# Patient Record
Sex: Male | Born: 1985 | Race: White | Hispanic: No | Marital: Married | Smoking: Never smoker
Health system: Southern US, Community
[De-identification: ages and names within clinical notes are randomized; demographics above are authoritative.]

## PROBLEM LIST (undated history)

## (undated) HISTORY — PX: ANTERIOR CRUCIATE LIGAMENT REPAIR: SHX115

---
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
## 2007-07-01 ENCOUNTER — Inpatient Hospital Stay (HOSPITAL_COMMUNITY): Admission: EM | Admit: 2007-07-01 | Discharge: 2007-07-03 | Payer: Self-pay | Admitting: Emergency Medicine

## 2010-09-01 NOTE — H&P (Signed)
[name], [name]               ACCOUNT NO.:  [id number]   MEDICAL RECORD NO.:  [id number]          PATIENT TYPE:  INP   LOCATION:  1826                         FACILITY:  [facility]   PHYSICIAN:  Michiel Cowboy, MDDATE OF BIRTH:  1985/05/14   DATE OF ADMISSION:  07/01/2007  DATE OF DISCHARGE:                              HISTORY & PHYSICAL   CHIEF COMPLAINT:  Unable to swallow.   This is a 25 year old gentleman with recent ACL tear, status post repair  with 1 week history of fevers, chills, severe sore throat, and fullness  in the ears.  The patient presented to walk-in clinic at the point where  he had hard time swallowing his own saliva for the past day or so.  The  patient was found to be Monospot positive. Given the severity of his  symptoms, [facility] hospitalist was called for possible admission for  rehydration since the patient unable to swallow, including his own  saliva.   PAST MEDICAL HISTORY:  Unremarkable, except for recent ACL repair.  The  patient is currently still on crutches.   MEDICATIONS:  Currently using Robaxin p.r.n. as well as  Hydrocodone/APAP.   ALLERGIES:  NONE.   SOCIAL HISTORY:  Significant for tobacco abuse and alcohol abuse.  Occasional marijuana use.   FAMILY HISTORY:  Noncontributory.   REVIEW OF SYSTEMS:  Positive for fevers and chills, as per HPI,  otherwise negative.   PHYSICAL EXAMINATION:  VITAL SIGNS:  Blood pressure 134/81.  Heart rate  134.  Respirations 16.  Temperature 102.7.  Satting 95% on air.  GENERAL:  Young male in no severe distress, laying on a stretcher,  clearly unable to swallow.  HEENT:  Head nontraumatic.  Oropharynx significant for severe tonsil  enlargement.  Kissing tonsils with wide spread white exudate, severe  lymphadenopathy noted.  Reddish rash present.  ABDOMEN:  Nondistended, scaphoid.  Hard to determine presence of  splenomegaly.  LOWER EXTREMITIES:  Without edema.  SKIN:  Turgor diminished.  NEUROLOGIC:  Intact.   LABS:  White blood cell count 16.5.  Hemoglobin 15.3.  Platelets 404.  Sodium 128, potassium 4.2, creatinine 0.7, total bili 1.3, alk phos 303.  HT 77, LT 187, albumin 3.5.  Per records, Monospot positive.   ASSESSMENT AND PLAN:  1. This is a 25 year old gentleman with possible mononucleosis with      severe tonsillitis.  Will treat pain with morphine and Zofran.      Will obtain Ears, Nose, Throat evaluation given degree of swelling      and difficulty swallowing saliva.  May benefit from Solu-Medrol.      Will also evaluate for other causes of tonsillitis.  Obtain a      throat culture.  Repeat Monospot, obtain EBV, viral load.  2. Dehydration.  Intravenous fluids.  3. Elevated liver function tests likely secondary to mononucleosis.      Will obtain abdominal ultrasound to evaluate for splenomegaly.   Will evaluate for other causes of severe tonsillitis.  Will obtain human  immuno virus.      Michiel Cowboy, MD  Electronically Signed  AVD/MEDQ  D:  07/01/2007  T:  07/01/2007  Job:  [id number]   cc:   L. Lupe Carney, M.D.

## 2010-09-01 NOTE — Discharge Summary (Signed)
[name], [name]               ACCOUNT NO.:  [id number]   MEDICAL RECORD NO.:  [id number]          PATIENT TYPE:  INP   LOCATION:  5152                         FACILITY:  [facility]   PHYSICIAN:  [name], MDDATE OF BIRTH:  01-Apr-1986   DATE OF ADMISSION:  07/01/2007  DATE OF DISCHARGE:  07/03/2007                               DISCHARGE SUMMARY   PRIMARY CARE [name]:  Allen Soho, MD.   CONSULTANTS:  1. Allen Braun, MD.  2. ENT.   DISCHARGE DIAGNOSES:  1. Mononucleosis.  2. Dehydration.  3. Toxic tonsillitis secondary to mononucleosis.  4. Likely superimposed bacterial infection.  5. LFT elevations likely secondary to mononucleosis.   HOSPITAL COURSE:  This is a 25 year old gentleman who presented to  [facility] with severe protein, Monospot was positive and  patient was noted to be dehydrated with difficulty swallowing, at which  point he was directed to go to the emergency department.  At the  emergency department [facility] Hospitalists were called to admit the  patient.  On admission patient has had difficulty swallowing his saliva.  He was evaluated by ENT who felt that there was no peritonsillar abscess  noted, diagnosed with toxic tonsillitis.  At this point patient was  admitted to hospital for IV hydration.  Given severity of his illness,  ID was also consulted.  He was seen by [name] who agreed with  short burst of steroids and a short dose of clindamycin for 5 days.  The  use of acyclovir in this situation was thought to be controversial.  Patient did seem to do much better with just short bursts of steroids,  at which point we will discharge him home with 5 days of prednisone 40  and five days of clindamycin; thereafter, patient can discontinue this.  The patient is to follow up with his primary care [name] in about 1  week.  Of note, patient has had a right upper quadrant ultrasound done  that showed slight splenomegaly, this  is probably related to  mononucleosis.  He also had slight LFT elevation which is probably  related to mononucleosis.  He is to have this repeated as an outpatient  to make sure that his LFT elevations are resolved.  Also, as a part of  his workup patient has had an RPR and HIV done which both were negative.  A Strep test was negative.  Patient was found to have low levels of IgG  against EPV which is probably indicative of just early infection and  most likely will become positive later on.  Patient was instructed to  not participate in contact sports for at least 4 to 6 months and to have  splenomegaly followed up as an outpatient.  Patient was also instructed  not to kiss or share drinks.   For the dehydration, patient received IV fluids while in-house.  At the  time of discharge able to take p.o.   Pain control.  He was given Magic Mouthwash which has seemed to help a  great deal.  Will discharge patient on that.   Recent  ACL tear.  Discussed his care with Orthopedics who will follow up  him closely tomorrow.  Would defer to Orthopedics if okay to continue  with short bursts of steroids.  If Orthopedics feels that steroids could  interfere with joint healing, it is okay to discontinue his steroids  early.      Allen Cowboy, MD  Electronically Signed     AVD/MEDQ  D:  07/03/2007  T:  07/03/2007  Job:  [id number]   cc:   [name], M.D.  Allen Sell, MD  [name], M.D.

## 2011-01-11 LAB — CBC
HCT: 43.5
HCT: 37.5 — ABNORMAL LOW
HCT: 42
Hemoglobin: 15.3
Hemoglobin: 12.9 — ABNORMAL LOW
Hemoglobin: 14.6
MCHC: 35.2
MCHC: 34.3
MCHC: 34.7
MCV: 91.6
MCV: 91.9
MCV: 93.2
Platelets: 404 — ABNORMAL HIGH
Platelets: 361
Platelets: 404 — ABNORMAL HIGH
RBC: 4.75
RBC: 4.08 — ABNORMAL LOW
RBC: 4.51
RDW: 12.7
RDW: 12.8
RDW: 12.9
WBC: 16.5 — ABNORMAL HIGH
WBC: 11.7 — ABNORMAL HIGH
WBC: 12.6 — ABNORMAL HIGH

## 2011-01-11 LAB — COMPREHENSIVE METABOLIC PANEL
ALT: 187 — ABNORMAL HIGH
ALT: 100 — ABNORMAL HIGH
ALT: 122 — ABNORMAL HIGH
AST: 77 — ABNORMAL HIGH
AST: 32
AST: 56 — ABNORMAL HIGH
Albumin: 3.5
Albumin: 2.7 — ABNORMAL LOW
Albumin: 2.8 — ABNORMAL LOW
Alkaline Phosphatase: 303 — ABNORMAL HIGH
Alkaline Phosphatase: 210 — ABNORMAL HIGH
Alkaline Phosphatase: 216 — ABNORMAL HIGH
BUN: 9
BUN: 8
BUN: 8
CO2: 24
CO2: 27
CO2: 29
Calcium: 8.6
Calcium: 8 — ABNORMAL LOW
Calcium: 8.8
Chloride: 94 — ABNORMAL LOW
Chloride: 102
Chloride: 98
Creatinine, Ser: 0.77
Creatinine, Ser: 0.57
Creatinine, Ser: 0.77
GFR calc Af Amer: >60
GFR calc Af Amer: >60
GFR calc Af Amer: >60
GFR calc non Af Amer: >60
GFR calc non Af Amer: >60
GFR calc non Af Amer: >60
Glucose, Bld: 112 — ABNORMAL HIGH
Glucose, Bld: 108 — ABNORMAL HIGH
Glucose, Bld: 120 — ABNORMAL HIGH
Potassium: 4.2
Potassium: 4.1
Potassium: 4.7
Sodium: 128 — ABNORMAL LOW
Sodium: 136
Sodium: 136
Total Bilirubin: 1.3 — ABNORMAL HIGH
Total Bilirubin: 0.7
Total Bilirubin: 0.8
Total Protein: 8
Total Protein: 6.3
Total Protein: 7.2

## 2011-01-11 LAB — CULTURE, BLOOD (ROUTINE X 2)
Culture: NO GROWTH
Culture: NO GROWTH

## 2011-01-11 LAB — DIFFERENTIAL
Basophils Absolute: 0
Basophils Relative: 0
Eosinophils Absolute: 0
Eosinophils Relative: 0
Lymphocytes Relative: 35
Lymphs Abs: 5.7 — ABNORMAL HIGH
Monocytes Absolute: 2.9 — ABNORMAL HIGH
Monocytes Relative: 17 — ABNORMAL HIGH
Neutro Abs: 7.9 — ABNORMAL HIGH
Neutrophils Relative %: 48

## 2011-01-11 LAB — RPR: RPR Ser Ql: NONREACTIVE

## 2011-01-11 LAB — EBV AB TO VIRAL CAPSID AG PNL, IGG+IGM
EBV VCA IgG: 0.52
EBV VCA IgM: 3.02 — ABNORMAL HIGH

## 2011-01-11 LAB — STREP A DNA PROBE: Group A Strep Probe: NEGATIVE

## 2011-01-11 LAB — HIV ANTIBODY (ROUTINE TESTING W REFLEX): HIV: NONREACTIVE

## 2011-01-11 LAB — MONONUCLEOSIS SCREEN: Mono Screen: POSITIVE — AB

## 2011-05-09 ENCOUNTER — Encounter (HOSPITAL_COMMUNITY): Payer: Self-pay | Admitting: *Deleted

## 2011-05-09 ENCOUNTER — Emergency Department (INDEPENDENT_AMBULATORY_CARE_PROVIDER_SITE_OTHER)
Admission: EM | Admit: 2011-05-09 | Discharge: 2011-05-09 | Disposition: A | Discharge status: Home / Self Care | Payer: 59 | Source: Home / Self Care

## 2011-05-09 DIAGNOSIS — S61209A Unspecified open wound of unspecified finger without damage to nail, initial encounter: Secondary | ICD-10-CM

## 2011-05-09 DIAGNOSIS — S61219A Laceration without foreign body of unspecified finger without damage to nail, initial encounter: Secondary | ICD-10-CM

## 2011-05-09 NOTE — ED Notes (Signed)
Pt c/o laceration to LEFT index fingertip x approx 1 hour ago. States he cut same w/ an exacto knife while "doing some arts and crafts".

## 2011-05-09 NOTE — ED Provider Notes (Signed)
Medical screening examination/treatment/procedure(s) were performed by non-physician practitioner and as supervising physician I was immediately available for consultation/collaboration.   Amberlynn Tempesta DOUGLAS MD.    Rever Pichette Douglas Courvoisier Hamblen, MD 05/09/11 1925 

## 2011-05-09 NOTE — ED Provider Notes (Signed)
History     CSN: [id number]  Arrival date & time 05/09/11  1819   None     No chief complaint on file.   (Consider location/radiation/quality/duration/timing/severity/associated sxs/prior treatment) HPI Comments: Pt c/o laceration to tip of Lt index finger approximately one hr ago. Pt states he was using an exacto knife. Bleeding has stopped.  Last Tetnus vaccine was in 2007.    History reviewed. No pertinent past medical history.  Past Surgical History  Procedure Date  . Anterior cruciate ligament repair     History reviewed. No pertinent family history.  History  Substance Use Topics  . Smoking status: Never Smoker   . Smokeless tobacco: Never Used  . Alcohol Use: 1.8 oz/week    3 Cans of beer per week      Review of Systems  Musculoskeletal: Negative for joint swelling.  Neurological: Negative for weakness and numbness.    Allergies  Review of patient's allergies indicates no known allergies.  Home Medications  No current outpatient prescriptions on file.  BP 132/84  Pulse 62  Temp(Src) 99.1 F (37.3 C) (Oral)  Resp 14  SpO2 100%  Physical Exam  Nursing note and vitals reviewed. Constitutional: He appears well-developed and well-nourished. No distress.  HENT:  Head: Normocephalic and atraumatic.  Musculoskeletal:       Left hand: He exhibits laceration. He exhibits normal range of motion, no tenderness, no bony tenderness, normal two-point discrimination, normal capillary refill, no deformity and no swelling. normal sensation noted. Normal strength noted.       Hands: Neurological: No sensory deficit.  Skin: Skin is warm and dry.  Psychiatric: He has a normal mood and affect.    ED Course  Procedures (including critical care time)  Labs Reviewed - No data to display No results found.   1. Laceration of finger, left       MDM   Small laceration, no bleeding and wound edges approximated.        Melody Comas, [place] 05/09/11 [phone]

## 2015-08-02 ENCOUNTER — Ambulatory Visit (INDEPENDENT_AMBULATORY_CARE_PROVIDER_SITE_OTHER): Payer: BLUE CROSS/BLUE SHIELD | Admitting: Family Medicine

## 2015-08-02 VITALS — BP 110/68 | HR 67 | Temp 98.7°F | Resp 18 | Ht 68.0 in | Wt 140.8 lb

## 2015-08-02 DIAGNOSIS — S61305A Unspecified open wound of left ring finger with damage to nail, initial encounter: Secondary | ICD-10-CM

## 2015-08-02 DIAGNOSIS — S61215A Laceration without foreign body of left ring finger without damage to nail, initial encounter: Secondary | ICD-10-CM | POA: Diagnosis not present

## 2015-08-02 DIAGNOSIS — S61219A Laceration without foreign body of unspecified finger without damage to nail, initial encounter: Secondary | ICD-10-CM

## 2015-08-02 DIAGNOSIS — Z23 Encounter for immunization: Secondary | ICD-10-CM

## 2015-08-02 DIAGNOSIS — T07XXXA Unspecified multiple injuries, initial encounter: Secondary | ICD-10-CM

## 2015-08-02 DIAGNOSIS — S61309A Unspecified open wound of unspecified finger with damage to nail, initial encounter: Secondary | ICD-10-CM

## 2015-08-02 NOTE — Progress Notes (Signed)
Patient ID: [name], male    DOB: [birth year]  Age: 30 y.o. MRN: [id number]  Chief Complaint  Patient presents with  . Finger Injury    left ring finger, pt. fell from skateboard     Subjective:   30 year old man who was skateboarding this morning and fell. He got a little abrasion on his left knee and his right forearm, but the main concern is that he tore the left fourth fingernail backwards. That is why he came on in. 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  Current allergies, medications, problem list, past/family and social histories reviewed.  Objective:  BP 110/68 mmHg  Pulse 67  Temp(Src) 98.7 F (37.1 C) (Oral)  Resp 18  Ht 5\' 8"  (1.727 m)  Wt 140 lb 12.8 oz (63.866 kg)  BMI 21.41 kg/m2  SpO2 99%  No major distress. Abrasions of the forearm and knee is noted. The left fourth finger has partially avulsed nail, which has reconnected normal position. The proximal aspect of the nail was pushed back under the edge of the skin. The skin flap was torn from proximal to distal along the medial half of the nail around the cuticle. It is a very tan laceration of the skin. No laceration of the nail plate can be seen, and I do not believe it tore into the matrix deep to that. Dermabond is used after irrigating well to try to reattach the skin in its place. He will try and protect the nail and see whether it persists in growing out normally, or whether he ends up having a partial evulsion that would require removal of the nail.  Procedure note: Laceration around the nailbeds along the cuticle was repaired with Dermabond. Patient was instructed in its care.  Assessment & Plan:   Assessment: No diagnosis found.    Plan: Per orders  No orders of the defined types were placed in this encounter.    No orders of the defined types were placed in this encounter.         Patient Instructions       IF you received an x-ray today,  you will receive an invoice from [facility]. Please contact [facility] at [phone] with questions or concerns regarding your invoice.   IF you received labwork today, you will receive an invoice from [facility]. Please contact [facility] at [phone] with questions or concerns regarding your invoice.   Our billing staff will not be able to assist you with questions regarding bills from these companies.  You will be contacted with the lab results as soon as they are available. The fastest way to get your results is to activate your My Chart account. Instructions are located on the last page of this paperwork. If you have not heard from us regarding the results in 2 weeks, please contact this office.         No Follow-up on file.   Oshay Stranahan, MD 08/02/2015

## 2015-08-02 NOTE — Patient Instructions (Addendum)
Protect fingertip as discussed.  If the nail seems to be avulsing please return for a recheck  Keep the other abrasions clean and dry.  You have received a tetanus vaccination today. They are maybe a little bit sore for a couple of days, takes Tylenol ibuprofen if needed for that discomfort and the discomfort of the fall.  Return at any time if needed.    IF you received an x-ray today, you will receive an invoice from [facility]. Please contact [facility] at [phone] with questions or concerns regarding your invoice.   IF you received labwork today, you will receive an invoice from [facility]. Please contact [facility] at [phone] with questions or concerns regarding your invoice.   Our billing staff will not be able to assist you with questions regarding bills from these companies.  You will be contacted with the lab results as soon as they are available. The fastest way to get your results is to activate your My Chart account. Instructions are located on the last page of this paperwork. If you have not heard from us regarding the results in 2 weeks, please contact this office.

## 2015-08-30 ENCOUNTER — Ambulatory Visit: Payer: BLUE CROSS/BLUE SHIELD

## 2016-08-09 DIAGNOSIS — D225 Melanocytic nevi of trunk: Secondary | ICD-10-CM | POA: Diagnosis not present

## 2016-08-09 DIAGNOSIS — D2262 Melanocytic nevi of left upper limb, including shoulder: Secondary | ICD-10-CM | POA: Diagnosis not present

## 2016-08-09 DIAGNOSIS — D2261 Melanocytic nevi of right upper limb, including shoulder: Secondary | ICD-10-CM | POA: Diagnosis not present

## 2016-08-09 DIAGNOSIS — D485 Neoplasm of uncertain behavior of skin: Secondary | ICD-10-CM | POA: Diagnosis not present

## 2016-08-09 DIAGNOSIS — L812 Freckles: Secondary | ICD-10-CM | POA: Diagnosis not present

## 2018-05-04 DIAGNOSIS — L259 Unspecified contact dermatitis, unspecified cause: Secondary | ICD-10-CM | POA: Diagnosis not present

## 2019-03-31 DIAGNOSIS — Z20828 Contact with and (suspected) exposure to other viral communicable diseases: Secondary | ICD-10-CM | POA: Diagnosis not present

## 2019-09-19 DIAGNOSIS — M4316 Spondylolisthesis, lumbar region: Secondary | ICD-10-CM | POA: Diagnosis not present

## 2019-09-19 DIAGNOSIS — M545 Low back pain: Secondary | ICD-10-CM | POA: Diagnosis not present

## 2019-09-19 DIAGNOSIS — M47816 Spondylosis without myelopathy or radiculopathy, lumbar region: Secondary | ICD-10-CM | POA: Diagnosis not present

## 2019-09-19 DIAGNOSIS — Z6820 Body mass index (BMI) 20.0-20.9, adult: Secondary | ICD-10-CM | POA: Diagnosis not present

## 2019-10-03 DIAGNOSIS — M6281 Muscle weakness (generalized): Secondary | ICD-10-CM | POA: Diagnosis not present

## 2019-10-03 DIAGNOSIS — M545 Low back pain: Secondary | ICD-10-CM | POA: Diagnosis not present

## 2019-10-03 DIAGNOSIS — R2689 Other abnormalities of gait and mobility: Secondary | ICD-10-CM | POA: Diagnosis not present

## 2019-10-11 DIAGNOSIS — M6281 Muscle weakness (generalized): Secondary | ICD-10-CM | POA: Diagnosis not present

## 2019-10-11 DIAGNOSIS — R2689 Other abnormalities of gait and mobility: Secondary | ICD-10-CM | POA: Diagnosis not present

## 2019-10-11 DIAGNOSIS — M545 Low back pain: Secondary | ICD-10-CM | POA: Diagnosis not present

## 2019-10-16 DIAGNOSIS — F411 Generalized anxiety disorder: Secondary | ICD-10-CM | POA: Diagnosis not present

## 2019-10-16 DIAGNOSIS — Z0000 Encounter for general adult medical examination without abnormal findings: Secondary | ICD-10-CM | POA: Diagnosis not present

## 2019-10-16 DIAGNOSIS — Z23 Encounter for immunization: Secondary | ICD-10-CM | POA: Diagnosis not present

## 2019-10-16 DIAGNOSIS — H6122 Impacted cerumen, left ear: Secondary | ICD-10-CM | POA: Diagnosis not present

## 2019-10-16 DIAGNOSIS — Z13220 Encounter for screening for lipoid disorders: Secondary | ICD-10-CM | POA: Diagnosis not present

## 2019-10-18 DIAGNOSIS — M545 Low back pain: Secondary | ICD-10-CM | POA: Diagnosis not present

## 2019-10-18 DIAGNOSIS — M6281 Muscle weakness (generalized): Secondary | ICD-10-CM | POA: Diagnosis not present

## 2019-10-18 DIAGNOSIS — R2689 Other abnormalities of gait and mobility: Secondary | ICD-10-CM | POA: Diagnosis not present

## 2019-10-25 DIAGNOSIS — M545 Low back pain: Secondary | ICD-10-CM | POA: Diagnosis not present

## 2019-10-25 DIAGNOSIS — M6281 Muscle weakness (generalized): Secondary | ICD-10-CM | POA: Diagnosis not present

## 2019-10-25 DIAGNOSIS — R2689 Other abnormalities of gait and mobility: Secondary | ICD-10-CM | POA: Diagnosis not present

## 2019-11-01 DIAGNOSIS — M545 Low back pain: Secondary | ICD-10-CM | POA: Diagnosis not present

## 2019-11-01 DIAGNOSIS — R2689 Other abnormalities of gait and mobility: Secondary | ICD-10-CM | POA: Diagnosis not present

## 2019-11-01 DIAGNOSIS — M6281 Muscle weakness (generalized): Secondary | ICD-10-CM | POA: Diagnosis not present

## 2019-11-15 DIAGNOSIS — Z6820 Body mass index (BMI) 20.0-20.9, adult: Secondary | ICD-10-CM | POA: Diagnosis not present

## 2019-11-15 DIAGNOSIS — M6281 Muscle weakness (generalized): Secondary | ICD-10-CM | POA: Diagnosis not present

## 2019-11-15 DIAGNOSIS — M545 Low back pain: Secondary | ICD-10-CM | POA: Diagnosis not present

## 2019-11-15 DIAGNOSIS — R2689 Other abnormalities of gait and mobility: Secondary | ICD-10-CM | POA: Diagnosis not present

## 2019-11-15 DIAGNOSIS — M4316 Spondylolisthesis, lumbar region: Secondary | ICD-10-CM | POA: Diagnosis not present

## 2019-11-15 DIAGNOSIS — M47816 Spondylosis without myelopathy or radiculopathy, lumbar region: Secondary | ICD-10-CM | POA: Diagnosis not present

## 2019-12-06 DIAGNOSIS — M6281 Muscle weakness (generalized): Secondary | ICD-10-CM | POA: Diagnosis not present

## 2019-12-06 DIAGNOSIS — M545 Low back pain: Secondary | ICD-10-CM | POA: Diagnosis not present

## 2019-12-06 DIAGNOSIS — R2689 Other abnormalities of gait and mobility: Secondary | ICD-10-CM | POA: Diagnosis not present

## 2019-12-20 DIAGNOSIS — M6281 Muscle weakness (generalized): Secondary | ICD-10-CM | POA: Diagnosis not present

## 2019-12-20 DIAGNOSIS — R2689 Other abnormalities of gait and mobility: Secondary | ICD-10-CM | POA: Diagnosis not present

## 2019-12-20 DIAGNOSIS — M545 Low back pain: Secondary | ICD-10-CM | POA: Diagnosis not present

## 2020-02-28 DIAGNOSIS — Z03818 Encounter for observation for suspected exposure to other biological agents ruled out: Secondary | ICD-10-CM | POA: Diagnosis not present

## 2020-02-28 DIAGNOSIS — Z20822 Contact with and (suspected) exposure to covid-19: Secondary | ICD-10-CM | POA: Diagnosis not present

## 2020-03-20 DIAGNOSIS — Z20822 Contact with and (suspected) exposure to covid-19: Secondary | ICD-10-CM | POA: Diagnosis not present

## 2020-03-28 DIAGNOSIS — Z20822 Contact with and (suspected) exposure to covid-19: Secondary | ICD-10-CM | POA: Diagnosis not present

## 2020-03-28 DIAGNOSIS — Z03818 Encounter for observation for suspected exposure to other biological agents ruled out: Secondary | ICD-10-CM | POA: Diagnosis not present

## 2020-05-06 DIAGNOSIS — Z20822 Contact with and (suspected) exposure to covid-19: Secondary | ICD-10-CM | POA: Diagnosis not present

## 2020-07-10 DIAGNOSIS — M4316 Spondylolisthesis, lumbar region: Secondary | ICD-10-CM | POA: Diagnosis not present

## 2020-07-10 DIAGNOSIS — M47816 Spondylosis without myelopathy or radiculopathy, lumbar region: Secondary | ICD-10-CM | POA: Diagnosis not present

## 2020-07-18 ENCOUNTER — Other Ambulatory Visit: Payer: Self-pay | Admitting: Orthopaedic Surgery

## 2020-07-18 DIAGNOSIS — M4316 Spondylolisthesis, lumbar region: Secondary | ICD-10-CM

## 2020-07-25 ENCOUNTER — Ambulatory Visit
Admission: RE | Admit: 2020-07-25 | Discharge: 2020-07-25 | Disposition: A | Discharge status: Home / Self Care | Payer: BC Managed Care – PPO | Source: Ambulatory Visit | Attending: Orthopaedic Surgery | Admitting: Orthopaedic Surgery

## 2020-07-25 DIAGNOSIS — M48061 Spinal stenosis, lumbar region without neurogenic claudication: Secondary | ICD-10-CM | POA: Diagnosis not present

## 2020-07-25 DIAGNOSIS — M4316 Spondylolisthesis, lumbar region: Secondary | ICD-10-CM

## 2020-07-25 DIAGNOSIS — M545 Low back pain, unspecified: Secondary | ICD-10-CM | POA: Diagnosis not present

## 2020-08-08 DIAGNOSIS — M47816 Spondylosis without myelopathy or radiculopathy, lumbar region: Secondary | ICD-10-CM | POA: Diagnosis not present

## 2020-08-08 DIAGNOSIS — M4316 Spondylolisthesis, lumbar region: Secondary | ICD-10-CM | POA: Diagnosis not present

## 2020-09-04 DIAGNOSIS — M47816 Spondylosis without myelopathy or radiculopathy, lumbar region: Secondary | ICD-10-CM | POA: Diagnosis not present

## 2020-09-04 DIAGNOSIS — M5417 Radiculopathy, lumbosacral region: Secondary | ICD-10-CM | POA: Diagnosis not present

## 2020-09-04 DIAGNOSIS — M4316 Spondylolisthesis, lumbar region: Secondary | ICD-10-CM | POA: Diagnosis not present

## 2020-09-16 DIAGNOSIS — M5417 Radiculopathy, lumbosacral region: Secondary | ICD-10-CM | POA: Diagnosis not present

## 2020-10-07 DIAGNOSIS — M5417 Radiculopathy, lumbosacral region: Secondary | ICD-10-CM | POA: Diagnosis not present

## 2020-10-07 DIAGNOSIS — M4316 Spondylolisthesis, lumbar region: Secondary | ICD-10-CM | POA: Diagnosis not present

## 2020-11-17 DIAGNOSIS — M5417 Radiculopathy, lumbosacral region: Secondary | ICD-10-CM | POA: Diagnosis not present

## 2020-11-17 DIAGNOSIS — M4316 Spondylolisthesis, lumbar region: Secondary | ICD-10-CM | POA: Diagnosis not present

## 2021-01-01 DIAGNOSIS — R519 Headache, unspecified: Secondary | ICD-10-CM | POA: Diagnosis not present

## 2021-01-01 DIAGNOSIS — J029 Acute pharyngitis, unspecified: Secondary | ICD-10-CM | POA: Diagnosis not present

## 2021-01-01 DIAGNOSIS — J208 Acute bronchitis due to other specified organisms: Secondary | ICD-10-CM | POA: Diagnosis not present

## 2021-01-01 DIAGNOSIS — Z20822 Contact with and (suspected) exposure to covid-19: Secondary | ICD-10-CM | POA: Diagnosis not present

## 2021-02-17 DIAGNOSIS — M4316 Spondylolisthesis, lumbar region: Secondary | ICD-10-CM | POA: Diagnosis not present

## 2021-02-17 DIAGNOSIS — M5417 Radiculopathy, lumbosacral region: Secondary | ICD-10-CM | POA: Diagnosis not present

## 2021-07-22 DIAGNOSIS — Z03818 Encounter for observation for suspected exposure to other biological agents ruled out: Secondary | ICD-10-CM | POA: Diagnosis not present

## 2021-07-22 DIAGNOSIS — J029 Acute pharyngitis, unspecified: Secondary | ICD-10-CM | POA: Diagnosis not present

## 2021-07-22 DIAGNOSIS — Z20822 Contact with and (suspected) exposure to covid-19: Secondary | ICD-10-CM | POA: Diagnosis not present

## 2021-08-17 DIAGNOSIS — M5417 Radiculopathy, lumbosacral region: Secondary | ICD-10-CM | POA: Diagnosis not present

## 2021-08-17 DIAGNOSIS — M4316 Spondylolisthesis, lumbar region: Secondary | ICD-10-CM | POA: Diagnosis not present

## 2021-09-23 DIAGNOSIS — M5417 Radiculopathy, lumbosacral region: Secondary | ICD-10-CM | POA: Diagnosis not present

## 2021-10-14 DIAGNOSIS — M5417 Radiculopathy, lumbosacral region: Secondary | ICD-10-CM | POA: Diagnosis not present

## 2021-10-14 DIAGNOSIS — M4316 Spondylolisthesis, lumbar region: Secondary | ICD-10-CM | POA: Diagnosis not present

## 2021-12-30 DIAGNOSIS — D225 Melanocytic nevi of trunk: Secondary | ICD-10-CM | POA: Diagnosis not present

## 2021-12-30 DIAGNOSIS — D1801 Hemangioma of skin and subcutaneous tissue: Secondary | ICD-10-CM | POA: Diagnosis not present

## 2021-12-30 DIAGNOSIS — D2261 Melanocytic nevi of right upper limb, including shoulder: Secondary | ICD-10-CM | POA: Diagnosis not present

## 2021-12-30 DIAGNOSIS — D2262 Melanocytic nevi of left upper limb, including shoulder: Secondary | ICD-10-CM | POA: Diagnosis not present

## 2021-12-30 DIAGNOSIS — D2271 Melanocytic nevi of right lower limb, including hip: Secondary | ICD-10-CM | POA: Diagnosis not present

## 2022-03-01 DIAGNOSIS — M4316 Spondylolisthesis, lumbar region: Secondary | ICD-10-CM | POA: Diagnosis not present

## 2022-03-01 DIAGNOSIS — M5417 Radiculopathy, lumbosacral region: Secondary | ICD-10-CM | POA: Diagnosis not present

## 2022-03-22 DIAGNOSIS — M5417 Radiculopathy, lumbosacral region: Secondary | ICD-10-CM | POA: Diagnosis not present

## 2022-04-22 DIAGNOSIS — M5417 Radiculopathy, lumbosacral region: Secondary | ICD-10-CM | POA: Diagnosis not present

## 2022-04-22 DIAGNOSIS — M4316 Spondylolisthesis, lumbar region: Secondary | ICD-10-CM | POA: Diagnosis not present

## 2022-05-04 DIAGNOSIS — Z7251 High risk heterosexual behavior: Secondary | ICD-10-CM | POA: Diagnosis not present

## 2022-05-04 DIAGNOSIS — N50812 Left testicular pain: Secondary | ICD-10-CM | POA: Diagnosis not present

## 2022-05-24 DIAGNOSIS — M4316 Spondylolisthesis, lumbar region: Secondary | ICD-10-CM | POA: Diagnosis not present

## 2022-05-24 DIAGNOSIS — M5417 Radiculopathy, lumbosacral region: Secondary | ICD-10-CM | POA: Diagnosis not present

## 2022-05-28 DIAGNOSIS — N50812 Left testicular pain: Secondary | ICD-10-CM | POA: Diagnosis not present

## 2022-06-21 DIAGNOSIS — M5417 Radiculopathy, lumbosacral region: Secondary | ICD-10-CM | POA: Diagnosis not present

## 2022-06-30 IMAGING — MR MR LUMBAR SPINE W/O CM
4 of 5 series · 26 of 48 positions shown · non-contrast
Comparison: None available.

CLINICAL DATA: Initial evaluation for low back pain with radiation
into the left lower extremity for several years, worsening.

EXAM:
MRI LUMBAR SPINE WITHOUT CONTRAST
TECHNIQUE: Multiplanar, multisequence MR imaging of the lumbar spine was
performed. No intravenous contrast was administered.

[Series 3: T2 · sagittal · 4.0mm · 0.59mm/px · 5 of 16 slices shown (1 of 2)]
[im 1/16]
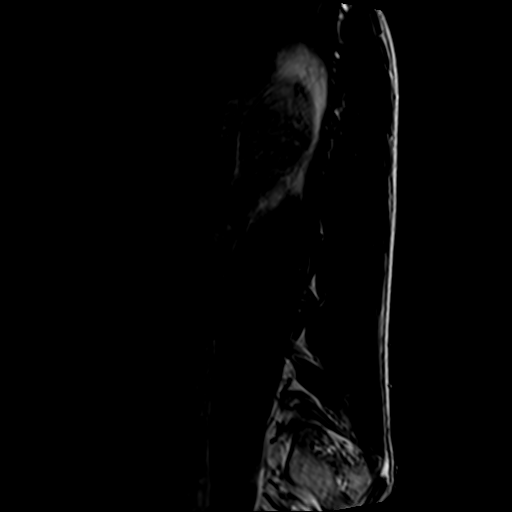
[im 4/16]
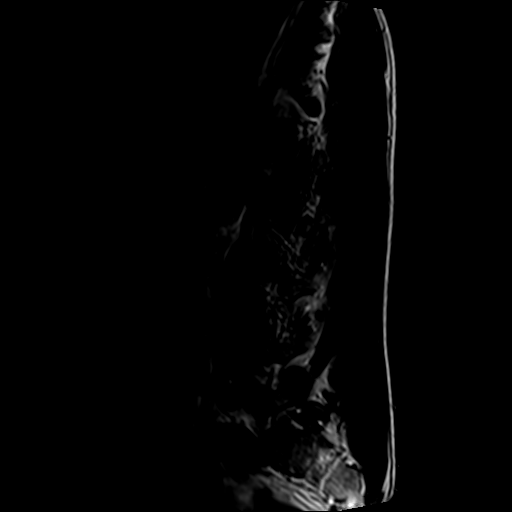
[im 8/16]
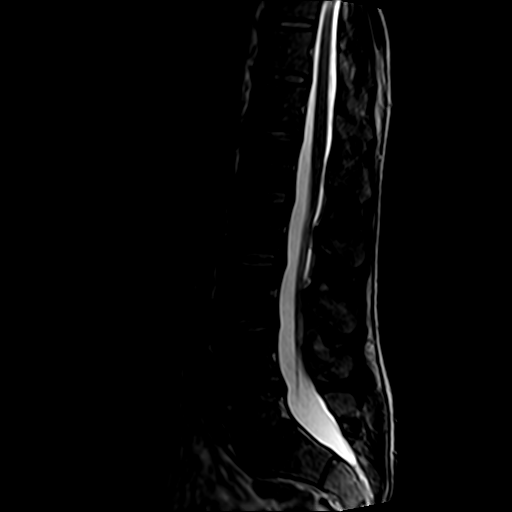
[im 12/16]
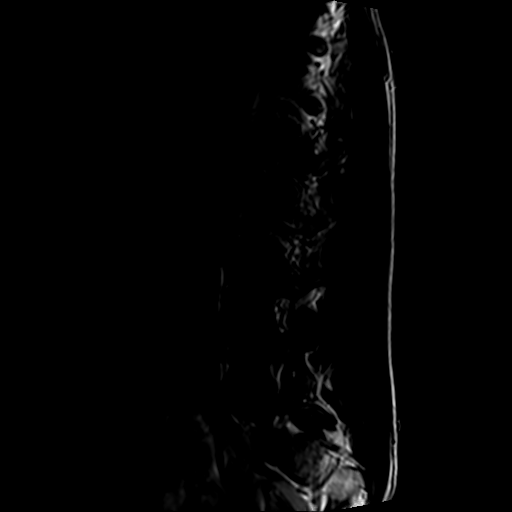
[im 16/16]
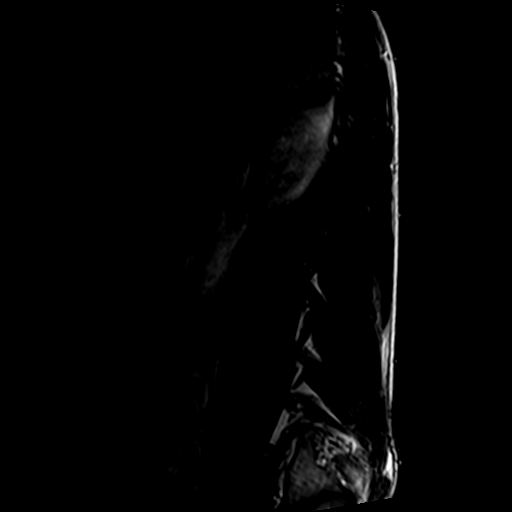

[Series 5: T1 · sagittal · 4.0mm · 0.59mm/px · 6 of 16 slices shown (1 of 2)]
[im 1/16]
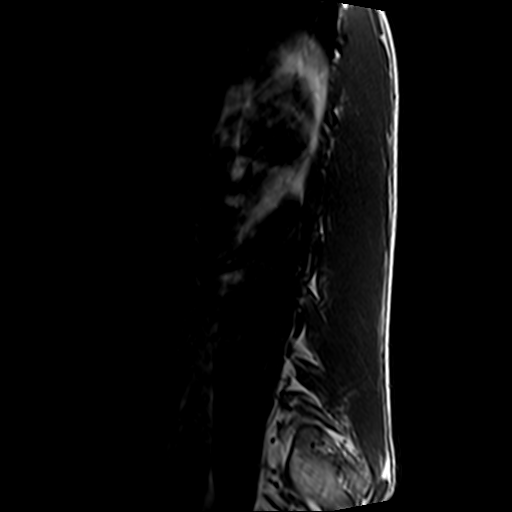
[im 4/16]
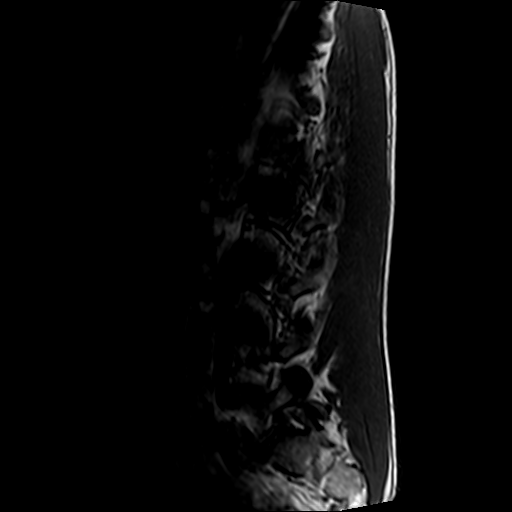
[im 7/16]
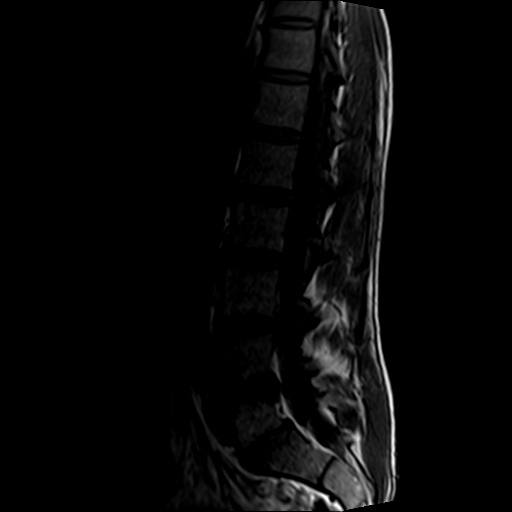
[im 10/16]
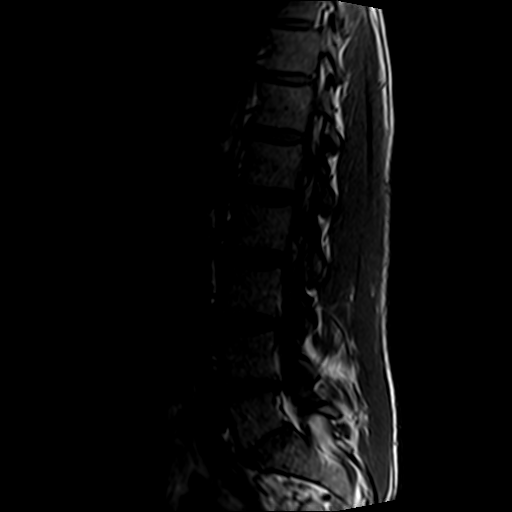
[im 13/16]
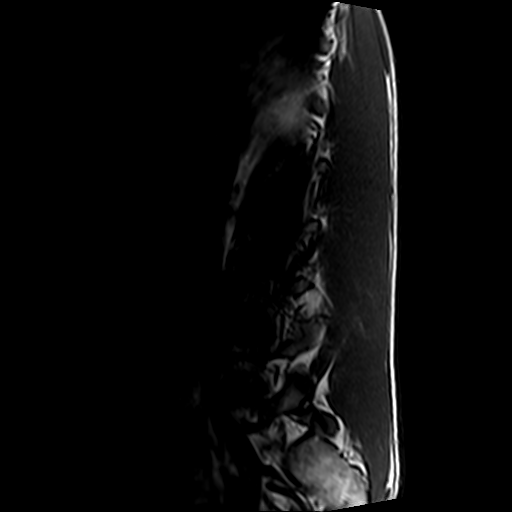
[im 16/16]
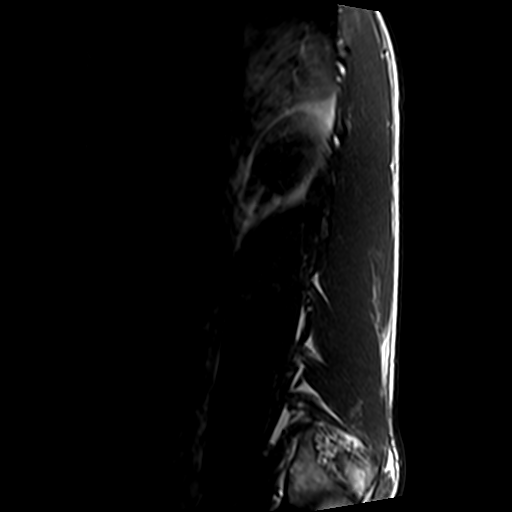

[Series 6: T2 · axial · 4.0mm · 0.70mm/px · z∈[-123,+101]mm · 10 of 44 slices shown (2 of 2)]
[im 3/44]
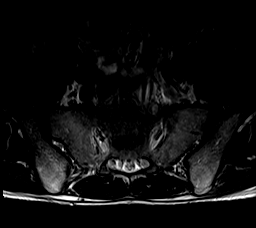
[im 6/44]
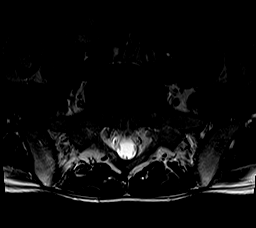
[im 9/44]
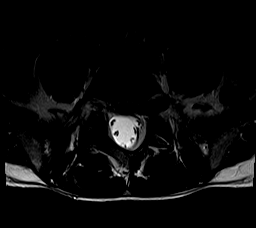
[im 15/44]
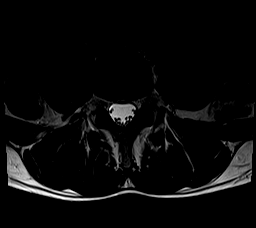
[im 21/44]
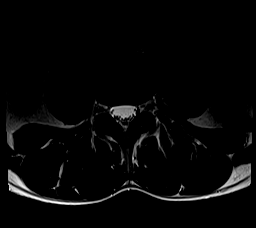
[im 23/44]
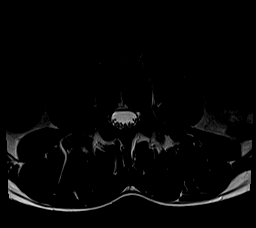
[im 26/44]
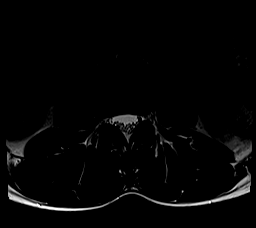
[im 32/44]
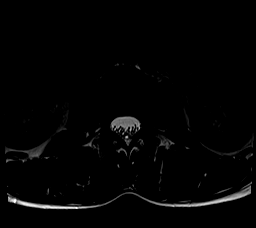
[im 38/44]
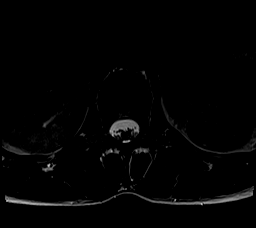
[im 44/44]
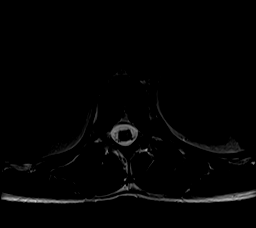

[Series 7: T1 · axial · 4.0mm · 0.35mm/px · z∈[-123,+69]mm · 5 of 44 slices shown (2 of 2)]
[im 3/44]
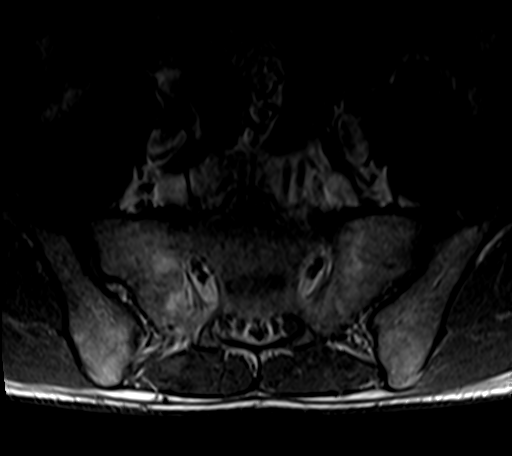
[im 6/44]
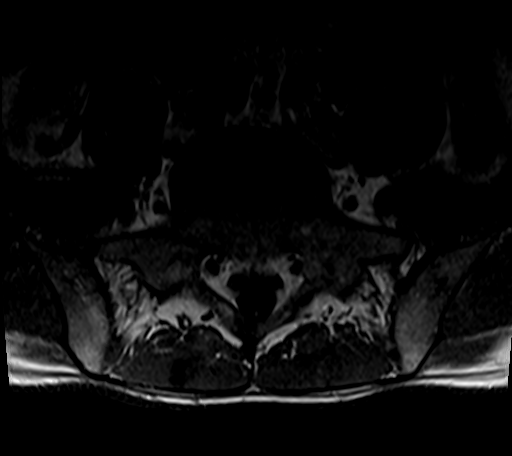
[im 9/44]
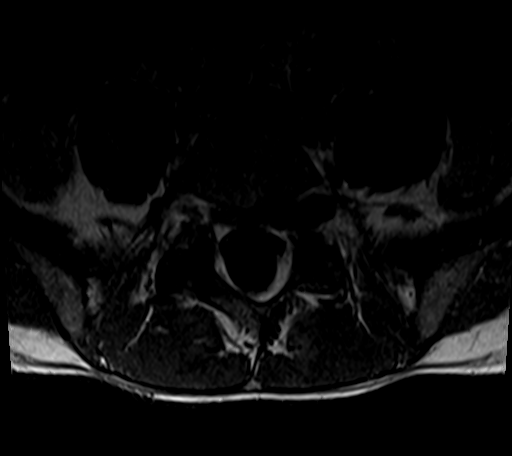
[im 23/44]
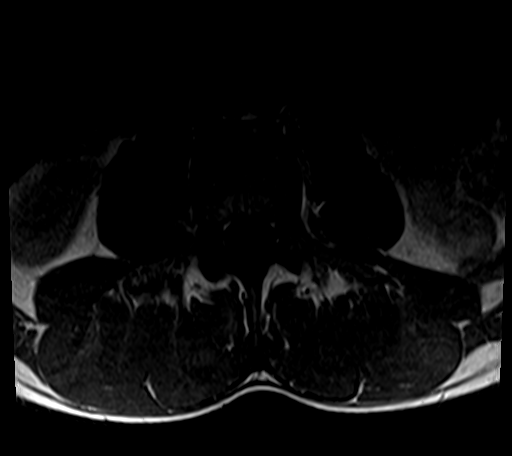
[im 38/44]
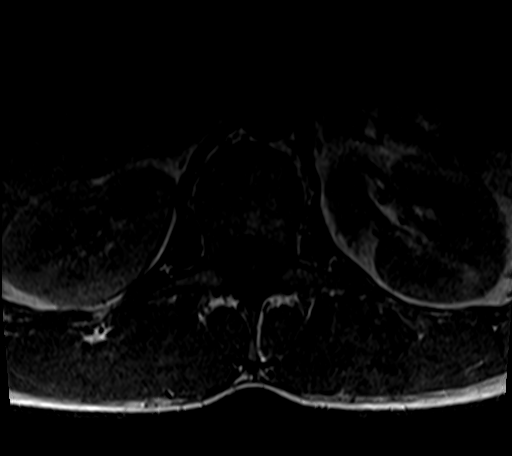

[26 of 48 positions shown; findings below may reference images not displayed]

FINDINGS: Segmentation: Transitional lumbosacral anatomy with partial
sacralization of the L5 vertebral body.

Alignment: Chronic bilateral pars defects at L5 with associated 3 mm
spondylolisthesis. Associated 3 mm retrolisthesis of L4 on L5.
Alignment otherwise normal preservation of the normal lumbar
lordosis.

Vertebrae: Vertebral body height maintained without acute or chronic
fracture. Bone marrow signal intensity within normal limits. No
discrete or worrisome osseous lesions. Discogenic reactive endplate
change present about the L4-5 interspace with associated mild marrow
edema. No other abnormal marrow edema.

Conus medullaris and cauda equina: Conus extends to the L1-2 level.
Conus and cauda equina appear normal.

Paraspinal and other soft tissues: Paraspinous soft tissues
visualized visceral structures are.

Disc levels:

Lumbar spine is image from the T10-11 level inferiorly. No
significant findings are seen through the L3-4 level.

L4-5: 3 mm retrolisthesis. Degenerative intervertebral disc space
narrowing with diffuse disc bulge and disc desiccation. Mild
reactive endplate change with marginal endplate spurring.
Superimposed broad-based central disc protrusion mildly indents the
ventral thecal sac, slightly asymmetric to the left. Superimposed
mild facet hypertrophy. Resultant mild narrowing of the left lateral
recess, descending left L5 nerve root level. Central canal remains
patent. Mild left L4 foraminal narrowing. No right foraminal
stenosis.

L5-S1: Chronic bilateral pars defects with associated 3 mm
spondylolisthesis. Mild reactive endplate spurring. Broad-based left
foraminal disc protrusion closely approximates the exiting left L5
nerve root as it courses of the left neural foramen (series 7, image
37). Superimposed mild facet hypertrophy. No canal or lateral recess
stenosis. Moderate left L5 foraminal narrowing. Right neural
foramina remains patent.
IMPRESSION: 1. Chronic bilateral pars defects at L5 with associated 3 mm
spondylolisthesis.
2. Broad-based left foraminal disc protrusion at L5-S1, closely
approximating and potentially irritating the exiting left L5 nerve
root.
3. Broad-based central disc protrusion at L4-5 with resultant mild
left lateral recess and left foraminal stenosis.

## 2022-07-06 DIAGNOSIS — M4316 Spondylolisthesis, lumbar region: Secondary | ICD-10-CM | POA: Diagnosis not present

## 2022-07-06 DIAGNOSIS — M5417 Radiculopathy, lumbosacral region: Secondary | ICD-10-CM | POA: Diagnosis not present

## 2023-02-03 DIAGNOSIS — D2262 Melanocytic nevi of left upper limb, including shoulder: Secondary | ICD-10-CM | POA: Diagnosis not present

## 2023-02-03 DIAGNOSIS — D2261 Melanocytic nevi of right upper limb, including shoulder: Secondary | ICD-10-CM | POA: Diagnosis not present

## 2023-02-03 DIAGNOSIS — D224 Melanocytic nevi of scalp and neck: Secondary | ICD-10-CM | POA: Diagnosis not present

## 2023-02-03 DIAGNOSIS — D225 Melanocytic nevi of trunk: Secondary | ICD-10-CM | POA: Diagnosis not present

## 2023-11-06 DIAGNOSIS — H60332 Swimmer's ear, left ear: Secondary | ICD-10-CM | POA: Diagnosis not present

## 2023-11-06 DIAGNOSIS — Z681 Body mass index (BMI) 19 or less, adult: Secondary | ICD-10-CM | POA: Diagnosis not present

## 2024-02-07 DIAGNOSIS — L812 Freckles: Secondary | ICD-10-CM | POA: Diagnosis not present

## 2024-02-07 DIAGNOSIS — D2262 Melanocytic nevi of left upper limb, including shoulder: Secondary | ICD-10-CM | POA: Diagnosis not present

## 2024-02-07 DIAGNOSIS — L308 Other specified dermatitis: Secondary | ICD-10-CM | POA: Diagnosis not present

## 2024-02-07 DIAGNOSIS — D2261 Melanocytic nevi of right upper limb, including shoulder: Secondary | ICD-10-CM | POA: Diagnosis not present

## 2024-02-07 DIAGNOSIS — D225 Melanocytic nevi of trunk: Secondary | ICD-10-CM | POA: Diagnosis not present
